# Patient Record
Sex: Female | Born: 1965 | Race: White | Hispanic: No | Marital: Married | Smoking: Never smoker
Health system: Southern US, Community
[De-identification: ages and names within clinical notes are randomized; demographics above are authoritative.]

---
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
## 1997-10-20 ENCOUNTER — Other Ambulatory Visit: Admission: RE | Admit: 1997-10-20 | Discharge: 1997-10-20 | Payer: Self-pay | Admitting: *Deleted

## 1999-08-01 ENCOUNTER — Other Ambulatory Visit: Admission: RE | Admit: 1999-08-01 | Discharge: 1999-08-01 | Payer: Self-pay | Admitting: Obstetrics and Gynecology

## 1999-10-06 ENCOUNTER — Encounter: Payer: Self-pay | Admitting: Obstetrics and Gynecology

## 1999-10-06 ENCOUNTER — Inpatient Hospital Stay (HOSPITAL_COMMUNITY): Admission: AD | Admit: 1999-10-06 | Discharge: 1999-10-06 | Payer: Self-pay | Admitting: Obstetrics and Gynecology

## 2000-01-31 ENCOUNTER — Inpatient Hospital Stay (HOSPITAL_COMMUNITY): Admission: AD | Admit: 2000-01-31 | Discharge: 2000-02-02 | Payer: Self-pay | Admitting: Obstetrics and Gynecology

## 2001-03-23 ENCOUNTER — Other Ambulatory Visit: Admission: RE | Admit: 2001-03-23 | Discharge: 2001-03-23 | Payer: Self-pay | Admitting: Obstetrics and Gynecology

## 2002-03-01 ENCOUNTER — Other Ambulatory Visit: Admission: RE | Admit: 2002-03-01 | Discharge: 2002-03-01 | Payer: Self-pay | Admitting: Obstetrics and Gynecology

## 2005-04-26 ENCOUNTER — Encounter: Admission: RE | Admit: 2005-04-26 | Discharge: 2005-04-26 | Payer: Self-pay | Admitting: Family Medicine

## 2006-01-24 ENCOUNTER — Ambulatory Visit (HOSPITAL_COMMUNITY): Admission: RE | Admit: 2006-01-24 | Discharge: 2006-01-24 | Payer: Self-pay | Admitting: Gastroenterology

## 2006-04-25 ENCOUNTER — Emergency Department (HOSPITAL_COMMUNITY): Admission: EM | Admit: 2006-04-25 | Discharge: 2006-04-25 | Payer: Self-pay | Admitting: Family Medicine

## 2006-06-09 ENCOUNTER — Ambulatory Visit: Payer: Self-pay | Admitting: Family Medicine

## 2006-07-01 ENCOUNTER — Encounter: Admission: RE | Admit: 2006-07-01 | Discharge: 2006-07-21 | Payer: Self-pay | Admitting: Family Medicine

## 2006-10-22 ENCOUNTER — Telehealth (INDEPENDENT_AMBULATORY_CARE_PROVIDER_SITE_OTHER): Payer: Self-pay | Admitting: *Deleted

## 2008-10-28 ENCOUNTER — Ambulatory Visit: Payer: Self-pay | Admitting: Family Medicine

## 2008-10-28 DIAGNOSIS — M545 Low back pain: Secondary | ICD-10-CM

## 2008-11-02 ENCOUNTER — Encounter: Admission: RE | Admit: 2008-11-02 | Discharge: 2008-12-09 | Payer: Self-pay | Admitting: Family Medicine

## 2008-12-08 ENCOUNTER — Encounter: Payer: Self-pay | Admitting: Family Medicine

## 2009-10-23 ENCOUNTER — Emergency Department (HOSPITAL_COMMUNITY): Admission: EM | Admit: 2009-10-23 | Discharge: 2009-10-23 | Payer: Self-pay | Admitting: Emergency Medicine

## 2009-10-26 ENCOUNTER — Emergency Department (HOSPITAL_COMMUNITY): Admission: EM | Admit: 2009-10-26 | Discharge: 2009-10-26 | Payer: Self-pay | Admitting: Family Medicine

## 2009-10-30 ENCOUNTER — Emergency Department (HOSPITAL_COMMUNITY)
Admission: EM | Admit: 2009-10-30 | Discharge: 2009-10-30 | Payer: Self-pay | Source: Home / Self Care | Admitting: Emergency Medicine

## 2009-11-06 ENCOUNTER — Emergency Department (HOSPITAL_COMMUNITY): Admission: EM | Admit: 2009-11-06 | Discharge: 2009-11-06 | Payer: Self-pay | Admitting: Emergency Medicine

## 2010-08-10 NOTE — Consult Note (Signed)
[facility]  Patient:    [name], [name]                        MRN: [id number] Adm. Date:  10/06/99 Attending:  Silverio Lay, M.D.                          Consultation Report  EMERGENCY ROOM VISIT  REASON FOR CONSULTATION:  Second trimester vaginal bleeding.  HISTORY OF PRESENT ILLNESS:  This is a young, married white female; (gravida 2, para 1, aborta 0).  She is presenting at 20 weeks and one day of pregnancy for spontaneous vaginal bleeding upon wiping, which occurred earlier this afternoon.  This recurred at one episode, but did not recur needing wearing of protection.  The patient reports otherwise good fetal activity; denies any contractions, denies any pain, denies any leakage of fluids.  She also denies any trauma.  Upon arrival at maternity admission unit, she was placed on the monitor (which did not reveal any contractions).  Fetal heart rate was within normal limits. Vital signs were normal.  Uterus fundi was measuring adequate for date and nontender.  Speculum examination revealed mild leukorrhea, with no bleeding inside and no blood in the vaginal vault.  The cervix was closed, long and firm.  LAB DATA:  CBC was normal.  DIC panel normal.  Urinalysis normal.  ULTRASOUND:  Revealed a normal lying placenta, with normal fetal anatomy. What was remarkable was a placental cord insertion site with a sift measuring 2.4 x 1.6 x 2.8 cm.  ASSESSMENT:  Second trimester bleeding of unknown origin.  Anomaly of placental cord insertion.  PLAN:  The patient was discharged home with restrain in physical activity. She will be followed in the office in three days for repeat ultrasound.  She is instructed to call back if recurrence of bleeding. DD:  10/06/99 TD:  10/06/99 Job: [id number]

## 2010-08-10 NOTE — Op Note (Signed)
[name], [name]               ACCOUNT NO.:  [id number]   MEDICAL RECORD NO.:  [id number]          PATIENT TYPE:  AMB   LOCATION:  ENDO                         FACILITY:  [facility]   PHYSICIAN:  Anselmo Rod, M.D.  DATE OF BIRTH:  07-17-1965   DATE OF PROCEDURE:  01/24/2006  DATE OF DISCHARGE:                                 OPERATIVE REPORT   PROCEDURE PERFORMED:  Colonoscopy.   ENDOSCOPIST:  Anselmo Rod, M.D.   INSTRUMENT USED:  Olympus video colonoscope.   INDICATIONS FOR PROCEDURE:  A 45 year old white female with a history of  change in bowel habits and severe right lower quadrant pain to rule out  colonic polyps, masses, IBD, etc.   PRE-PROCEDURE PREPARATION:  Informed consent was procured from the patient.  The patient was fasted for eight hours prior to the procedure and prepped  with a gallon of TriLyte the night prior to the procedure.  Risks and  benefits of the procedure, including a 10% missed rate of cancer and polyps,  were discussed with the patient as well.   PRE-PROCEDURE PHYSICAL EXAMINATION:  VITAL SIGNS:  Stable vital signs.  NECK:  Supple.  CHEST:  Clear to auscultation.  HEART:  S1 and S2.  Regular.  ABDOMEN:  Soft with normal bowel sounds.   DESCRIPTION OF PROCEDURE:  The patient was placed in the left lateral  decubitus position and sedated with 140 mcg of fentanyl and 13.5 mg of  Versed in slow, incremental doses.  Once the patient was adequately sedated,  maintained on low flow oxygen, and continuous cardiac monitoring, the  Olympus video colonoscope was advanced from the rectum to the cecum with  difficulty.  The patient had a very tortuous colon.  The patient's position  was changed from the left lateral to the right lateral and then the supine  position with gentle application of abdominal pressure, reached the cecal  base.  The appendicle orifices and ileocecal valve were visualized and  photographed.  No masses, polyps, erosions,  ulcerations, or diverticula were  seen.  The terminal ileum appeared normal without lesions.  Retroflexion in  the rectum revealed small internal hemorrhoids with a prominent anal  papilla.  The patient tolerated the procedure well without complications.   IMPRESSION:  1. Unrevealing colonoscopy up to the terminal ileum except for a tortuous      colon.  2. Patient had abdominal discomfort with insufflation of air into the      colon, indicating a component of visceral hypersensitivity, consistent      with irritable bowel syndrome.   RECOMMENDATIONS:  1. Continue a high-fiber diet with liberal fluid intake.  2. Repeat colonoscopy at age 59 and if the patient develops any abnormal      symptoms in the interim.  3. Outpatient followup in the next two weeks for further recommendations.      Anselmo Rod, M.D.  Electronically Signed     JNM/MEDQ  D:  01/24/2006  T:  01/24/2006  Job:  [id number]   cc:   Lenoard Aden, M.D.  Alexia Freestone,  M.D. 

## 2010-08-10 NOTE — H&P (Signed)
[facility]  Patient:    [name], [name]                        MRN: [id number] Adm. Date:  [id number] Attending:  Silverio Lay A                         History and Physical  REASON FOR ADMISSION:         Intrauterine pregnancy at 36 weeks and 2 days with spontaneous rupture of membranes at 1:00 a.m.  HISTORY OF PRESENT ILLNESS:   This is a 45 year old married white female Gravida 3 Para 1 AB 1 with a due date of February 23, 2000, being admitted at 36+ weeks for a spontaneous rupture of membranes at 1:00 a.m. of clear fluid. She reports irregular uterine activity.  Reports good fetal activity.  Denies any bleeding and denies any symptoms of pregnancy-induced hypertension. She was evaluated at maternity admissions with positive sign, positive nitrazine, and reactive fetal heart rate tracing.  She is known Group B strep positive. Patient would like to avoid Pitocin augmentation, so she was allowed to ambulate for a few hours.  She is currently contracting every one-and-a-half to three minutes but very mild intensity, 3/10, and fetal heart rate is reacting.  She has already received one dose of penicillin.  PRENATAL COURSE:              Reveals blood type O-negative, RPR nonreactive, rubella immune, HBS staging negative, HIV declined, gonorrhea negative, chlamydia negative, Pap smear normal.  In July, patient was seen in maternity admissions for second trimester bleeding where a placental cyst was diagnosed on ultrasound measuring 2.4 x 1.6 x 2.8 cm.  Ultrasound was repeated at 20 weeks and revealed a subchorionic hematoma of 2.2 x 0.9 cm and cyst resolved. At 16 weeks, ASP was within normal limits.  A 25-week ultrasound, normal placenta, estimated fetal weight of 56 percentile.  A 28-week glucose tolerance test was within normal limits.  RhoGAM received at 28 weeks. Otherwise, prenatal course was uneventful.  PAST MEDICAL HISTORY:         No known  drug allergies.  1990, termination of pregnancy at 5 weeks without complication.  June 1998, spontaneous vaginal delivery at 36 weeks of a female infant weighing 5 pounds 8 ounces, no complication.  Status post LEEP in 1995.  FAMILY HISTORY:               Father and brother with asthma.  SOCIAL HISTORY:               Married, nonsmoker.  PHYSICAL EXAMINATION:  VITAL SIGNS:                  Normal.  HEENT:                        Normal.  NECK:                         Normal.  LUNGS:                        Clear.  HEART:                        Normal.  ABDOMEN:  Gravida, nontender.  Vertex presentation.  PELVIC:                       Vaginal exam done at maternity admissions on arrival at 2 cm, 50% effaced, posterior vertex -1 to 0.  EXTREMITIES:                  Negative.  ASSESSMENT:                   Intrauterine pregnancy at 36+ weeks with spontaneous rupture of membranes and Group B strep positive.  PLAN:                         Patient will be started on pitocin augmentation and will be on penicillin protocol.  Spontaneous vaginal delivery is expected. D:  01/31/00 TD:  01/31/00 Job: [id number]

## 2010-08-10 NOTE — Assessment & Plan Note (Signed)
[facility]                            [facility] OFFICE NOTE   NAME:[name], [name]                      MRN:          [id number]  DATE:06/09/2006                            DOB:          January 02, 1966    This is a 45 year old woman here to establish with our practice and is  also complaining of an injury to her left ankle.  On  January 31 of this  year while carrying a load of laundry down some steps at her home she  tripped and fell, twisting her left ankle.  She went to an urgent care  center and was seen that day.  X-rays were normal except for some soft  tissue swelling.  She was given crutches for several days and wore an  aircast splint for several days.  She used ice for several days and took  Motrin.  A lot of the initial pain and swelling went away, but she has  still had continued difficulty with stiffness, swelling and pain both on  the inside and outside of the left ankle ever since.  Another problem  she incidentally mentions is of an itchy chronic rash that comes and  goes under both arm pits.  She had been prescribed steroid creams in the  past.  Tis seemed to help a lot with the itching but inevitably the rash  would come back.   PAST MEDICAL HISTORY:  She has had two vaginal deliveries.  She sees Dr.  Billy Coast for regular gynecology care.  She has never had a surgery.   ALLERGIES:  None.   CURRENT MEDICATIONS:  Necon once daily.   HABITS:  She drinks some alcohol.  She does not use tobacco.   SOCIAL HISTORY:  She is married with two children.  She is a Futures trader.   FAMILY HISTORY:  Remarkable for prostate cancer in a grandfather and  hypertension in her parents.   OBJECTIVE:  VITAL SIGNS:  Height 5 feet 8 inches.  Weight 135.  BP  108/72.  Pulse 76 and regular.  GENERAL:  She appears to be healthy.  EXTREMITIES:  She walks with no discernible limp.  There is some mild  puffiness around the left lateral and medial malleoli, both  medially and  laterally she is somewhat tender to palpation and has a mildly decreased  range of motion in all directions.  SKIN:  Examination of the axilla reveals no masses and no discernible  rash.   ASSESSMENT/PLAN:  1. Ankle sprain probably with some significant tibiotalar and      fibulotalar ligament injury.  We will set her up for physical      therapy for the next two to four weeks to see if this can aid in      her      recovery.  2. Possible tinea in the axillae.  Nizoral cream b.i.d. as needed.  I      advised her to avoid wearing deodorant until this clears up.     Tera Mater. Clent Ridges, MD  Electronically Signed    SAF/MedQ  DD: 06/09/2006  DT: 06/09/2006  Job #: [id number]

## 2010-09-15 IMAGING — CR DG LUMBAR SPINE COMPLETE 4+V
5 series · 5 of 5 positions shown · non-contrast
Comparison: None available.

CLINICAL DATA: Lumbar back pain.  Low back pain.

LUMBAR SPINE - COMPLETE 4+ VIEW

[view not recorded (1 of 5)]
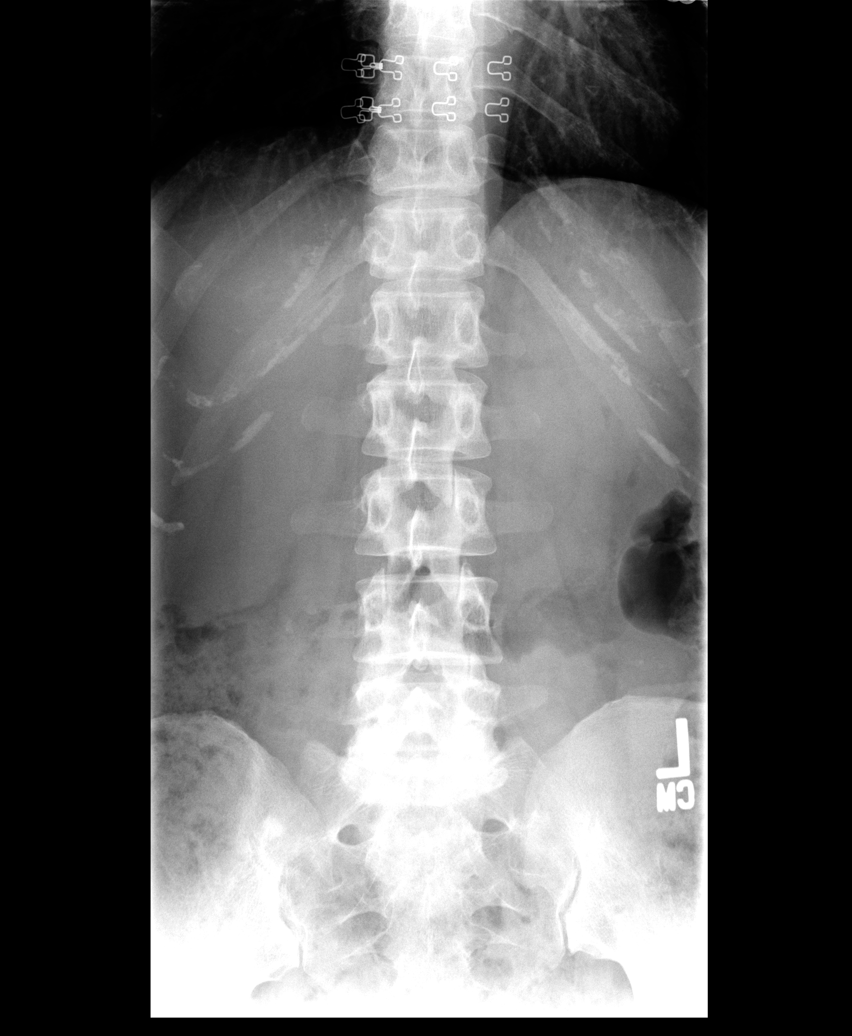

[view not recorded (2 of 5)]
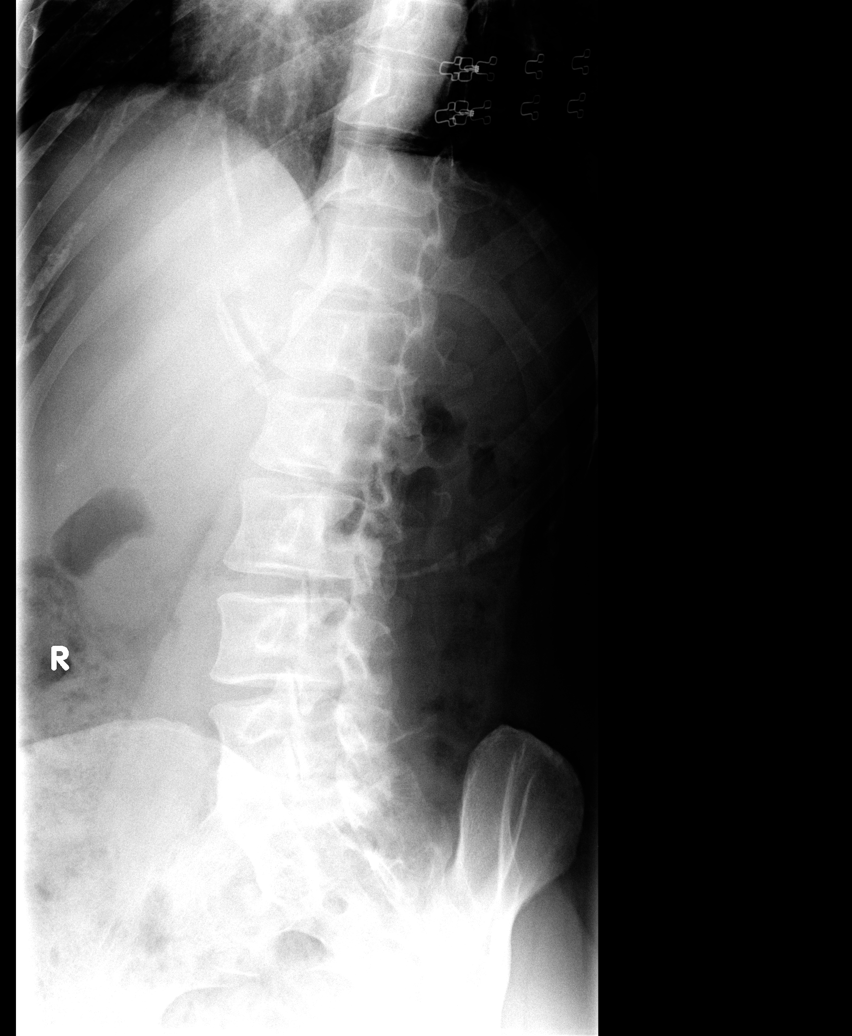

[view not recorded (3 of 5)]
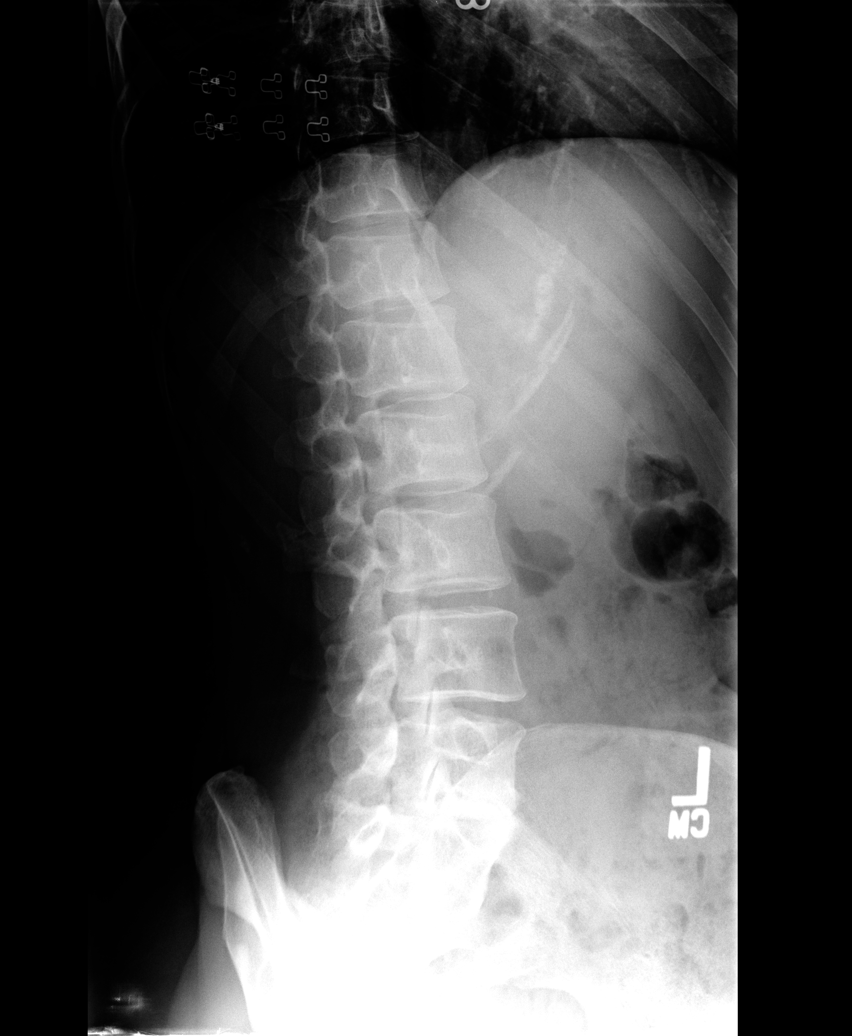

[view not recorded (4 of 5)]
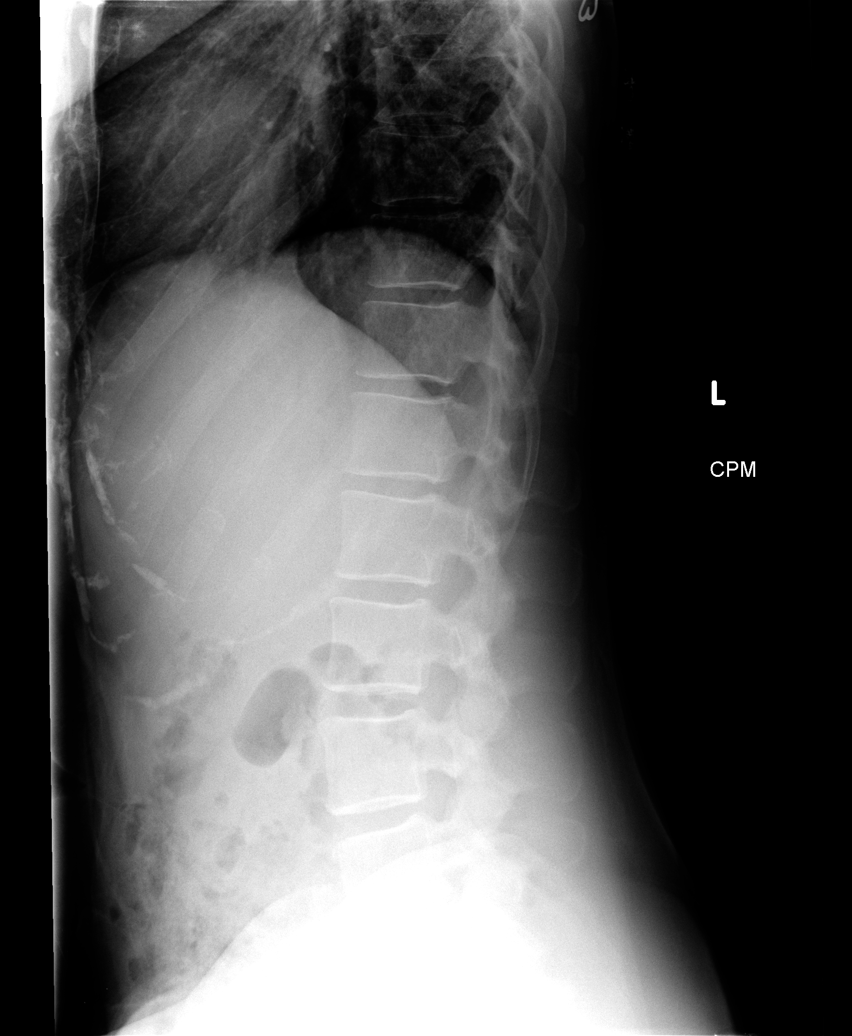

[view not recorded (5 of 5)]
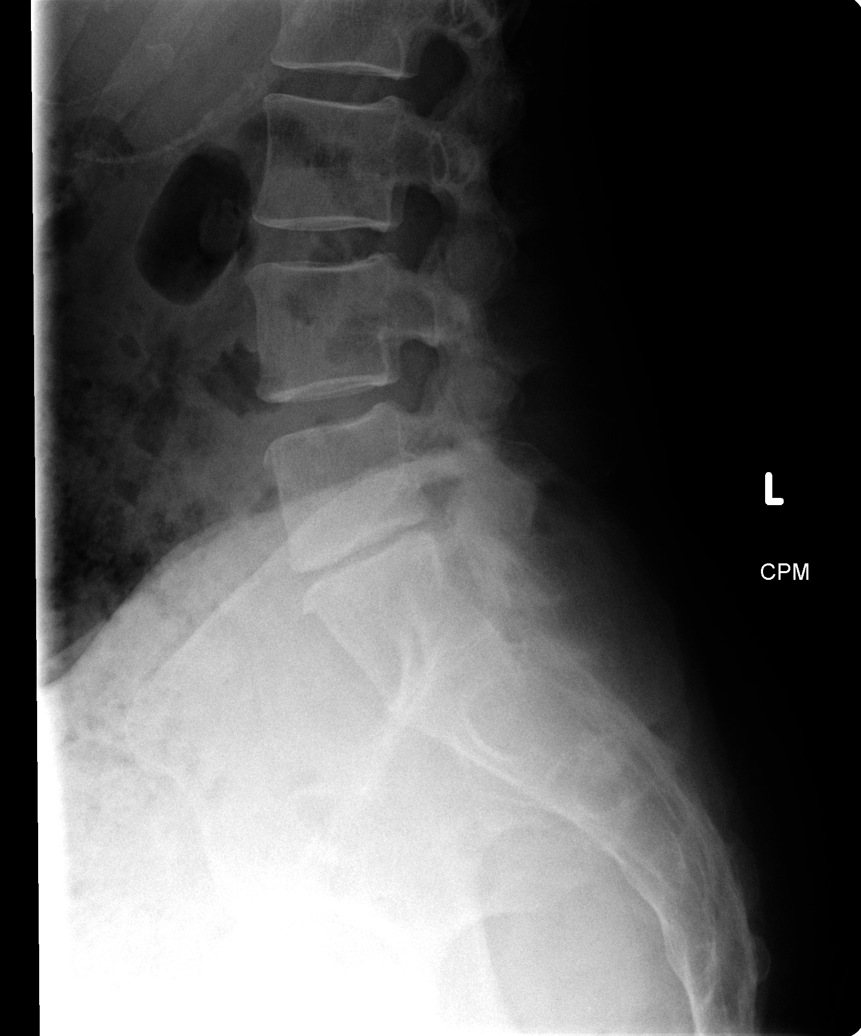

[5 of 5 positions shown; findings below may reference images not displayed]

FINDINGS: Lumbosacral transitional anatomy is present with a lumbar
rise to S1 vertebral body.  Five non-rib bearing lumbar type
vertebral bodies are present.  No pars defects are identified.  The
alignment is anatomic.  Disc space loss is present at L4-L5 with
marginal osteophytes.  Mild spurring anteriorly at L5.
IMPRESSION: 1.  Lumbosacral transitional anatomy.
2.  No acute osseous abnormality.  L5-S1 degenerative disease.

## 2011-02-06 ENCOUNTER — Ambulatory Visit (INDEPENDENT_AMBULATORY_CARE_PROVIDER_SITE_OTHER): Payer: BC Managed Care – PPO | Admitting: Family Medicine

## 2011-02-06 ENCOUNTER — Encounter: Payer: Self-pay | Admitting: Family Medicine

## 2011-02-06 VITALS — BP 124/82 | HR 86 | Temp 98.3°F | Wt 141.0 lb

## 2011-02-06 DIAGNOSIS — J329 Chronic sinusitis, unspecified: Secondary | ICD-10-CM

## 2011-02-06 MED ORDER — AZITHROMYCIN 250 MG PO TABS: ORAL_TABLET | ORAL | Status: AC

## 2011-02-06 NOTE — Progress Notes (Signed)
  Subjective:    Patient ID: [name], female    DOB: 11-16-1965, 45 y.o.   MRN: [id number]  HPI Here for 6 weeks of sinus pressure and PND, ST,and a dry cough. No fever.    Review of Systems  Constitutional: Negative.   HENT: Positive for congestion, postnasal drip and sinus pressure.   Eyes: Negative.   Respiratory: Positive for cough.        Objective:   Physical Exam  Constitutional: She appears well-developed and well-nourished.  HENT:  Right Ear: External ear normal.  Left Ear: External ear normal.  Nose: Nose normal.  Mouth/Throat: Oropharynx is clear and moist. No oropharyngeal exudate.  Eyes: Conjunctivae are normal. Pupils are equal, round, and reactive to light.  Neck: No thyromegaly present.  Pulmonary/Chest: Effort normal and breath sounds normal.  Lymphadenopathy:    She has no cervical adenopathy.          Assessment & Plan:  Add Mucinex

## 2011-03-26 HISTORY — PX: COLONOSCOPY: SHX174

## 2011-06-03 ENCOUNTER — Encounter: Payer: Self-pay | Admitting: Internal Medicine

## 2011-06-03 ENCOUNTER — Ambulatory Visit (INDEPENDENT_AMBULATORY_CARE_PROVIDER_SITE_OTHER): Payer: BC Managed Care – PPO | Admitting: Internal Medicine

## 2011-06-03 ENCOUNTER — Telehealth: Payer: Self-pay | Admitting: Family Medicine

## 2011-06-03 VITALS — BP 130/90 | Temp 97.9°F | Wt 140.0 lb

## 2011-06-03 DIAGNOSIS — J019 Acute sinusitis, unspecified: Secondary | ICD-10-CM

## 2011-06-03 NOTE — Progress Notes (Signed)
  Subjective:    Patient ID: [name], female    DOB: 07-Aug-1965, 46 y.o.   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 At times sputum production is yellow. She has been using ibuprofen and Tylenol for symptom control.  She has a child at home who has been ill and has been out of school last week. Her fever has improved    Review of Systems  Constitutional: Positive for fever and fatigue.  HENT: Positive for congestion, rhinorrhea, voice change and postnasal drip. Negative for hearing loss, sore throat, dental problem, sinus pressure and tinnitus.   Eyes: Negative for pain, discharge and visual disturbance.  Respiratory: Positive for cough. Negative for shortness of breath.   Cardiovascular: Negative for chest pain, palpitations and leg swelling.  Gastrointestinal: Negative for nausea, vomiting, abdominal pain, diarrhea, constipation, blood in stool and abdominal distention.  Genitourinary: Negative for dysuria, urgency, frequency, hematuria, flank pain, vaginal bleeding, vaginal discharge, difficulty urinating, vaginal pain and pelvic pain.  Musculoskeletal: Negative for joint swelling, arthralgias and gait problem.  Skin: Negative for rash.  Neurological: Negative for dizziness, syncope, speech difficulty, weakness, numbness and headaches.  Hematological: Negative for adenopathy.  Psychiatric/Behavioral: Negative for behavioral problems, dysphoric mood and agitation. The patient is not nervous/anxious.        Objective:   Physical Exam  Constitutional: She is oriented to person, place, and time. She appears well-developed and well-nourished.  HENT:  Head: Normocephalic.  Right Ear: External ear normal.  Left Ear: External ear normal.  Mouth/Throat: Oropharynx is clear and moist.  Eyes: Conjunctivae and EOM are normal. Pupils are equal, round, and reactive to  light.  Neck: Normal range of motion. Neck supple. No thyromegaly present.  Cardiovascular: Normal rate, regular rhythm, normal heart sounds and intact distal pulses.   Pulmonary/Chest: Effort normal and breath sounds normal.  Abdominal: Soft. Bowel sounds are normal. She exhibits no mass. There is no tenderness.  Musculoskeletal: Normal range of motion.  Lymphadenopathy:    She has no cervical adenopathy.  Neurological: She is alert and oriented to person, place, and time.  Skin: Skin is warm and dry. No rash noted.  Psychiatric: She has a normal mood and affect. Her behavior is normal.          Assessment & Plan:   Viral rhinosinusitis. We'll treat symptomatically.

## 2011-06-03 NOTE — Patient Instructions (Signed)
Acute sinusitis symptoms for less than 10 days are generally not helped by antibiotic therapy.  Use saline irrigation, warm  moist compresses and over-the-counter decongestants only as directed.  Call if there is no improvement in 5 to 7 days, or sooner if you develop increasing pain, fever, or any new symptoms.  Tylenol 1-2 tabs po q4h prn  duexis one twice daily

## 2011-06-03 NOTE — Telephone Encounter (Signed)
Triage Record Num: [id number] Operator: Craig Guess Patient Name: [name] Call Date & Time: 06/02/2011 11:00:29AM Patient Phone: [phone] PCP: Tera Mater. Clent Ridges Patient Gender: Female PCP Fax : [phone] Patient DOB: [birth year] Practice Name: [facility] Reason for Call: Caller: Chloey/Patient; PCP: Nelwyn Salisbury.; CB#: [phone]. Caller reports she has had a fever since Wed 3/6. Congestion, headache and ear pain. Caller wonders if she has a sinus infection. Current temp is 100.8. OTC product at hs did not help with sxs. Caller asking if there is a clinic today. Caller advised no clinic today, advised she can be seen at [facility] today or use Home care (per URI) that was given and be seen in the office on Monday 3/11. Caller prefers UC today and was given info re same. Protocol(s) Used: Upper Respiratory Infection (URI) Recommended Outcome per Protocol: See Provider within 24 hours Reason for Outcome: Facial pain (fullness, pressure, worsens with bending over), frontal headache, yellow-green nasal discharge AND any temperature elevation Care Advice: ~ Use a cool mist humidifier to moisten air. Be sure to clean according to manufacturer's instructions. ~ See provider in 8 hours if redness, swelling and tenderness develops above or below eyes/near ears/over sinuses ~ Consider use of a saline nasal spray per package directions to help relieve nasal congestion. A warm, moist compress placed on face, over eyes for 15 to 20 minutes, 5 to 6 times a day, may help relieve the congestion. ~ Consider nonprescription decongestant (Sudafed, Drixoral) for relief of symptoms after checking with a provider, especially if there is a history of hypertension, hyperthyroidism, heart disease, diabetes, glaucoma, urinary retention caused by prostatic hypertrophy. ~ Analgesic/Antipyretic Advice - Acetaminophen: Consider acetaminophen as directed on label or by pharmacist/provider for pain  or fever PRECAUTIONS: - Use if there is no history of liver disease, alcoholism, or intake of three or more alcohol drinks per day - Only if approved by provider during pregnancy or when breastfeeding - During pregnancy, acetaminophen should not be taken more than 3 consecutive days without telling provider - Do not exceed recommended dose or frequency ~ Analgesic/Antipyretic Advice - NSAIDs: Consider aspirin, ibuprofen, naproxen or ketoprofen for pain or fever as directed on label or by pharmacist/provider. PRECAUTIONS: - If over 73 years of age, should not take longer than 1 week without consulting provider. EXCEPTIONS: - Should not be used if taking blood thinners or have bleeding problems. - Do not use if have history of sensitivity/allergy to any of these medications; or history of cardiovascular, ulcer, kidney, liver disease or diabetes unless approved by provider. - Do not exceed recommended dose or frequency. ~ 06/02/2011 11:08:40AM Page 1 of 1 [email or web address]

## 2013-01-18 ENCOUNTER — Telehealth: Payer: Self-pay | Admitting: Family Medicine

## 2013-01-18 NOTE — Telephone Encounter (Signed)
Okay per Dr. Fry.  

## 2013-01-18 NOTE — Telephone Encounter (Signed)
Pt would like MD to accept her husband as new pt. Can I sch?

## 2013-01-20 NOTE — Telephone Encounter (Signed)
Pt will have her hus callback to sch appt

## 2014-05-03 ENCOUNTER — Encounter: Payer: Self-pay | Admitting: Family Medicine

## 2014-05-03 ENCOUNTER — Ambulatory Visit (INDEPENDENT_AMBULATORY_CARE_PROVIDER_SITE_OTHER): Payer: BLUE CROSS/BLUE SHIELD | Admitting: Family Medicine

## 2014-05-03 VITALS — BP 122/73 | HR 93 | Temp 100.2°F | Ht 68.25 in | Wt 142.0 lb

## 2014-05-03 DIAGNOSIS — J0100 Acute maxillary sinusitis, unspecified: Secondary | ICD-10-CM

## 2014-05-03 MED ORDER — AZITHROMYCIN 250 MG PO TABS: ORAL_TABLET | ORAL | Status: DC

## 2014-05-03 NOTE — Progress Notes (Signed)
Pre visit review using our clinic review tool, if applicable. No additional management support is needed unless otherwise documented below in the visit note. 

## 2014-05-03 NOTE — Progress Notes (Signed)
   Subjective:    Patient ID: [name], female    DOB: Feb 18, 1966, 49 y.o.   MRN: [id number]  HPI Here for one week of HA across the forehead and in the temples, pressure in both ears, and PND. No cough. She has had a low grade fever.    Review of Systems  Constitutional: Positive for fever.  HENT: Positive for congestion, postnasal drip and sinus pressure.   Eyes: Negative.   Respiratory: Negative.        Objective:   Physical Exam  Constitutional: She appears well-developed and well-nourished.  HENT:  Right Ear: External ear normal.  Left Ear: External ear normal.  Nose: Nose normal.  Mouth/Throat: Oropharynx is clear and moist.  Eyes: Conjunctivae are normal.  Pulmonary/Chest: Effort normal and breath sounds normal.  Lymphadenopathy:    She has no cervical adenopathy.          Assessment & Plan:  Add Mucinex D. Recheck prn

## 2014-08-11 ENCOUNTER — Telehealth: Payer: Self-pay | Admitting: Family Medicine

## 2014-08-11 ENCOUNTER — Ambulatory Visit (INDEPENDENT_AMBULATORY_CARE_PROVIDER_SITE_OTHER): Payer: BLUE CROSS/BLUE SHIELD | Admitting: Family Medicine

## 2014-08-11 ENCOUNTER — Encounter: Payer: Self-pay | Admitting: Family Medicine

## 2014-08-11 VITALS — BP 130/80 | HR 84 | Temp 97.5°F | Wt 141.0 lb

## 2014-08-11 DIAGNOSIS — G44209 Tension-type headache, unspecified, not intractable: Secondary | ICD-10-CM

## 2014-08-11 DIAGNOSIS — R233 Spontaneous ecchymoses: Secondary | ICD-10-CM | POA: Diagnosis not present

## 2014-08-11 DIAGNOSIS — H1131 Conjunctival hemorrhage, right eye: Secondary | ICD-10-CM

## 2014-08-11 LAB — CBC WITH DIFFERENTIAL/PLATELET
BASOS ABS: 0 10*3/uL (ref 0.0–0.1)
BASOS PCT: 0.4 % (ref 0.0–3.0)
EOS PCT: 1 % (ref 0.0–5.0)
Eosinophils Absolute: 0.1 10*3/uL (ref 0.0–0.7)
HCT: 44.3 % (ref 36.0–46.0)
Hemoglobin: 15.1 g/dL — ABNORMAL HIGH (ref 12.0–15.0)
Lymphocytes Relative: 26.7 % (ref 12.0–46.0)
Lymphs Abs: 1.9 10*3/uL (ref 0.7–4.0)
MCHC: 34 g/dL (ref 30.0–36.0)
MCV: 94.1 fl (ref 78.0–100.0)
MONO ABS: 0.4 10*3/uL (ref 0.1–1.0)
Monocytes Relative: 5.8 % (ref 3.0–12.0)
Neutro Abs: 4.8 10*3/uL (ref 1.4–7.7)
Neutrophils Relative %: 66.1 % (ref 43.0–77.0)
Platelets: 195 10*3/uL (ref 150.0–400.0)
RBC: 4.71 Mil/uL (ref 3.87–5.11)
RDW: 12.8 % (ref 11.5–15.5)
WBC: 7.3 10*3/uL (ref 4.0–10.5)

## 2014-08-11 NOTE — Telephone Encounter (Signed)
PLEASE NOTE: All timestamps contained within this report are represented as [place] Standard Time. CONFIDENTIALTY NOTICE: This fax transmission is intended only for the addressee. It contains information that is legally privileged, confidential or otherwise protected from use or disclosure. If you are not the intended recipient, you are strictly prohibited from reviewing, disclosing, copying using or disseminating any of this information or taking any action in reliance on or regarding this information. If you have received this fax in error, please notify us immediately by telephone so that we can arrange for its return to us. Phone: [phone], Toll-Free: [phone], Fax: [phone] Page: 1 of 1 Call Id: [id number]  [facility] - Client TELEPHONE ADVICE RECORD [facility] Patient Name: [name] DOB: 11-02-1965 Initial Comment Caller states Monday she noticed blood vessel in eye had popped; friends adv to have BP checked cause headache since Monday, too; Nurse Assessment Nurse: Elijah Birkaldwell, RN, Lynda Date/Time (Eastern Time): 08/11/2014 10:17:25 AM Confirm and document reason for call. If symptomatic, describe symptoms. ---Caller states Monday she noticed a blood vessel in her right eye had popped. Her friends advised her to have BP checked because she has had headache since Monday, too. No injury/ strenuous exercise. Has also noticed a rash at bottom of both shins. Has the patient traveled out of the country within the last 30 days? ---Not Applicable Does the patient require triage? ---Yes Related visit to physician within the last 2 weeks? ---No Does the PT have any chronic conditions? (i.e. diabetes, asthma, etc.) ---Yes List chronic conditions. ---hypothyroidism Did the patient indicate they were pregnant? ---No Guidelines Guideline Title Affirmed Question Affirmed Notes Headache [1] MODERATE headache (e.g., interferes with  normal activities) AND [2] present > 24 hours AND [3] unexplained (Exceptions: analgesics not tried, typical migraine, or headache part of viral illness) Final Disposition User See Physician within [address]24 Hours [place], Charity fundraiserN, [place]

## 2014-08-11 NOTE — Progress Notes (Signed)
   Subjective:    Patient ID: [name], female    DOB: 01/27/1966, 49 y.o.   MRN: [id number]  HPI Patient seen as a work in with the following issues  Right subconjunctival hemorrhage. She first noted Monday morning around 9:30 to 10 AM. No history of injury. No blurred vision. No eye pain. Does not take aspirin or any anticoagulants. She has not noted any recent excessive bruising.  Headache. Dull mild bilateral frontal sinus type headache over the past few days. No nausea or vomiting. She took some ibuprofen which helped. Denies any sinusitis symptoms. No sinus congestion. Headache relatively mild. No fever whatsoever  Mild faint petechial type rash on lower legs bilaterally. Possibly some very mild leg edema. No recent tick bites. No upper extremity or trunk petechiae. No easy bruising other than right subconjunctival hemorrhage above. No new arthralgias.  No past medical history on file. No past surgical history on file.  reports that she has never smoked. She has never used smokeless tobacco. She reports that she drinks alcohol. She reports that she does not use illicit drugs. family history includes Hypertension in an other family member; Prostate cancer in an other family member. No Known Allergies    Review of Systems  Constitutional: Negative for fever, chills and fatigue.  Eyes: Negative for photophobia, pain, discharge, itching and visual disturbance.  Respiratory: Negative for shortness of breath.   Cardiovascular: Negative for chest pain.  Gastrointestinal: Negative for nausea, vomiting and abdominal pain.  Musculoskeletal: Negative for arthralgias.  Skin: Positive for rash.  Neurological: Positive for headaches. Negative for dizziness.  Hematological: Negative for adenopathy.       Objective:   Physical Exam  Constitutional: She appears well-developed and well-nourished.  HENT:  Head: Normocephalic and atraumatic.  Mouth/Throat: Oropharynx is clear and  moist.  Eyes: Pupils are equal, round, and reactive to light.  Subconjunctival hemorrhage right eye laterally. Pupils equal round reactive to light. Fundi benign.  Neck: Neck supple. No thyromegaly present.  Cardiovascular: Normal rate and regular rhythm.   No murmur heard. Pulmonary/Chest: Effort normal and breath sounds normal. No respiratory distress. She has no wheezes. She has no rales.  Lymphadenopathy:    She has no cervical adenopathy.  Neurological: She is alert. No cranial nerve deficit. Coordination normal.  Skin:  She has very fine nonblanching petechiae lower legs. None involving the feet and none involving the upper extremities or trunk. No pitting leg edema          Assessment & Plan:  #1 right subconjunctival hemorrhage. We explained these are usually spontaneous and benign. She does not have any concerning symptoms otherwise and observe for now #2 dull headache. Suspect tension-type headache. We explained this is not likely related to her spontaneous subconjunctival hemorrhage.  No fever whatsoever and no history of tick bites #3 few scattered small petechiae lower legs. She does not have any concerns for infectious origin such as fever or chills. She does not have evidence for any bruising. No generalized petechiae. Will check CBC even though again think unrelated to subconjunctival hemorrhage above No recent tick bites or other worrisome symptoms such as fever and generally feels well. Follow-up promptly for any fever or new symptoms

## 2014-08-11 NOTE — Progress Notes (Signed)
Pre visit review using our clinic review tool, if applicable. No additional management support is needed unless otherwise documented below in the visit note. 

## 2014-08-11 NOTE — Telephone Encounter (Signed)
Has appointment to see MD Burchette today at 1:45

## 2014-08-11 NOTE — Patient Instructions (Signed)
Subconjunctival Hemorrhage A subconjunctival hemorrhage is a bright red patch covering a portion of the white of the eye. The white part of the eye is called the sclera, and it is covered by a thin membrane called the conjunctiva. This membrane is clear, except for tiny blood vessels that you can see with the naked eye. When your eye is irritated or inflamed and becomes red, it is because the vessels in the conjunctiva are swollen. Sometimes, a blood vessel in the conjunctiva can break and bleed. When this occurs, the blood builds up between the conjunctiva and the sclera, and spreads out to create a red area. The red spot may be very small at first. It may then spread to cover a larger part of the surface of the eye, or even all of the visible white part of the eye. In almost all cases, the blood will go away and the eye will become white again. Before completely dissolving, however, the red area may spread. It may also become brownish-yellow in color before going away. If a lot of blood collects under the conjunctiva, it may look like a bulge on the surface of the eye. This looks scary, but it will also eventually flatten out and go away. Subconjunctival hemorrhages do not cause pain, but if swollen, may cause a feeling of irritation. There is no effect on vision.  CAUSES   The most common cause is mild trauma (rubbing the eye, irritation).  Subconjunctival hemorrhages can happen because of coughing or straining (lifting heavy objects), vomiting, or sneezing.  In some cases, your doctor may want to check your blood pressure. High blood pressure can also cause a subconjunctival hemorrhage.  Severe trauma or blunt injuries.  Diseases that affect blood clotting (hemophilia, leukemia).  Abnormalities of blood vessels behind the eye (carotid cavernous sinus fistula).  Tumors behind the eye.  Certain drugs (aspirin, Coumadin, heparin).  Recent eye surgery. HOME CARE INSTRUCTIONS   Do not worry  about the appearance of your eye. You may continue your usual activities.  Often, follow-up is not necessary. SEEK MEDICAL CARE IF:   Your eye becomes painful.  The bleeding does not disappear within 3 weeks.  Bleeding occurs elsewhere, for example, under the skin, in the mouth, or in the other eye.  You have recurring subconjunctival hemorrhages. SEEK IMMEDIATE MEDICAL CARE IF:   Your vision changes or you have difficulty seeing.  You develop a severe headache, persistent vomiting, confusion, or abnormal drowsiness (lethargy).  Your eye seems to bulge or protrude from the eye socket.  You notice the sudden appearance of bruises or have spontaneous bleeding elsewhere on your body. Document Released: 03/11/2005 Document Revised: 07/26/2013 Document Reviewed: 02/06/2009 [facility] Patient Information 2015 [employer], [place]. This information is not intended to replace advice given to you by your health care provider. Make sure you discuss any questions you have with your health care provider.  Be in touch for any fever or progressive/spreading petechiae.

## 2015-07-17 ENCOUNTER — Encounter: Payer: Self-pay | Admitting: Family Medicine

## 2015-07-17 ENCOUNTER — Ambulatory Visit (INDEPENDENT_AMBULATORY_CARE_PROVIDER_SITE_OTHER): Payer: BLUE CROSS/BLUE SHIELD | Admitting: Family Medicine

## 2015-07-17 VITALS — BP 146/87 | HR 74 | Temp 98.1°F | Wt 148.2 lb

## 2015-07-17 DIAGNOSIS — L989 Disorder of the skin and subcutaneous tissue, unspecified: Secondary | ICD-10-CM

## 2015-07-17 MED ORDER — DESONIDE 0.05 % EX CREA: TOPICAL_CREAM | Freq: Two times a day (BID) | CUTANEOUS | Status: DC

## 2015-07-17 NOTE — Progress Notes (Signed)
Pre visit review using our clinic review tool, if applicable. No additional management support is needed unless otherwise documented below in the visit note. 

## 2015-07-17 NOTE — Patient Instructions (Signed)
Please use trial of steroid medication for one week and if lesion does not improve, a referral to dermatology will be made. [facility] can be considered for evaluation and management.

## 2015-07-17 NOTE — Progress Notes (Addendum)
s    Patient ID: [name], female    DOB: 1965/11/06, 50 y.o.   MRN: [id number]  HPI  [name] is a 50 year old female who presents today with a lesion on her face. She states that this lesion has been present for at least one month and possibly as long as 2 months. She describes it as scaly, pruritic, with redness that has not improved since she first noticed the lesion. She denies associated symptoms of fever, chills, sweats, unexplained weight loss, or any additional lesions.  No treatments have been tried at home at this time. Pertinent history of previous sun exposure   Review of Systems  Constitutional: Negative for fever, chills, fatigue and unexpected weight change.  Respiratory: Negative for cough and shortness of breath.   Cardiovascular: Negative for chest pain and palpitations.  Gastrointestinal: Negative for nausea, vomiting, abdominal pain and diarrhea.  Musculoskeletal: Negative for myalgias, joint swelling and arthralgias.  Skin:       Lesion on right side of face  Neurological: Negative for dizziness, numbness and headaches.  Hematological: Does not bruise/bleed easily.   No past medical history on file.   Social History   Social History  . Marital Status: Married    Spouse Name: N/A  . Number of Children: N/A  . Years of Education: N/A   Occupational History  . Not on file.   Social History Main Topics  . Smoking status: Never Smoker   . Smokeless tobacco: Never Used  . Alcohol Use: 0.0 oz/week    0 Standard drinks or equivalent per week     Comment: occ  . Drug Use: No  . Sexual Activity: Not on file   Other Topics Concern  . Not on file   Social History Narrative    No past surgical history on file.  Family History  Problem Relation Age of Onset  . Hypertension    . Prostate cancer      No Known Allergies  Current Outpatient Prescriptions on File Prior to Visit  Medication Sig Dispense Refill  . levothyroxine (SYNTHROID,  LEVOTHROID) 75 MCG tablet Take 75 mcg by mouth daily.  10  . norethindrone-ethinyl estradiol (JUNEL FE,GILDESS FE,LOESTRIN FE) 1-20 MG-MCG tablet Take 1 tablet by mouth daily.     No current facility-administered medications on file prior to visit.    BP 146/87 mmHg  Pulse 74  Temp(Src) 98.1 F (36.7 C) (Oral)  Wt 148 lb 3.2 oz (67.223 kg)       Objective:   Physical Exam  Constitutional: She is oriented to person, place, and time. She appears well-developed and well-nourished.  Eyes: Pupils are equal, round, and reactive to light.  Neck: Neck supple.  Cardiovascular: Normal rate and regular rhythm.   Pulmonary/Chest: Effort normal and breath sounds normal. She has no wheezes. She has no rales.  Lymphadenopathy:    She has no cervical adenopathy.  Neurological: She is alert and oriented to person, place, and time.  Skin: Skin is warm and dry.  Facial lesion located on the right side of face near jaw line approximately 1 cm x 1 cm. Scaly with redness noted at the base  Psychiatric: She has a normal mood and affect. Her behavior is normal. Thought content normal.     Assessment and Plan:  1. Skin lesion of face Advised patient that a trial of steroid cream for one week will be used and if lesion does not respond or improve a  referral to [facility] will be place for evaluation and treatment. Discussed the possibility of squamous cell carcinoma versus actinic keratosis and treatment of this nature would include destructive therapies or surgical procedure. Due to lesion located on patient's face, a referral to the [facility] will be placed if needed after trial of desonide. Also advised to use sunscreen.  Patient agreed with plan and voiced understanding.  - desonide (DESOWEN) 0.05 % cream; Apply topically 2 (two) times daily.  Dispense: 30 g; Refill: 0  Diane McJulia Jaimie Redditt, FNP-C

## 2016-10-07 ENCOUNTER — Other Ambulatory Visit: Payer: Self-pay | Admitting: Gastroenterology

## 2016-10-07 DIAGNOSIS — R1084 Generalized abdominal pain: Secondary | ICD-10-CM

## 2016-10-10 ENCOUNTER — Other Ambulatory Visit: Payer: BLUE CROSS/BLUE SHIELD

## 2017-02-05 ENCOUNTER — Ambulatory Visit: Payer: BLUE CROSS/BLUE SHIELD | Admitting: Family Medicine

## 2017-02-05 ENCOUNTER — Encounter: Payer: Self-pay | Admitting: Family Medicine

## 2017-02-05 VITALS — BP 102/60 | HR 89 | Temp 98.5°F | Wt 146.0 lb

## 2017-02-05 DIAGNOSIS — T1592XA Foreign body on external eye, part unspecified, left eye, initial encounter: Secondary | ICD-10-CM | POA: Diagnosis not present

## 2017-02-06 ENCOUNTER — Encounter: Payer: Self-pay | Admitting: Family Medicine

## 2017-02-06 NOTE — Progress Notes (Signed)
   Subjective:    Patient ID: [name], female    DOB: 1965/07/26, 51 y.o.   MRN: [id number]  HPI Here complaining of a foreign body sensation in the left eye. On 02-01-17 she was helping to remove ceiling tiles and some dust fell into her face. She immediately felt like something was in the eye, though there is no pain per se. She has tried to flush this out under a faucet, but this has not helped.    Review of Systems  Constitutional: Negative.   HENT: Negative.   Eyes: Negative for photophobia, pain, discharge, redness and visual disturbance.  Respiratory: Negative.        Objective:   Physical Exam  Constitutional: She appears well-developed and well-nourished.  HENT:  Right Ear: External ear normal.  Left Ear: External ear normal.  Nose: Nose normal.  Mouth/Throat: Oropharynx is clear and moist.  Eyes: Conjunctivae and EOM are normal. Pupils are equal, round, and reactive to light.  No objects can be seen in the eye. No corneal defects are seen using fluorescein and a Woods lamp   Neck: Neck supple. No thyromegaly present.  Cardiovascular: Normal rate, regular rhythm, normal heart sounds and intact distal pulses.  Pulmonary/Chest: Effort normal and breath sounds normal.  Lymphadenopathy:    She has no cervical adenopathy.          Assessment & Plan:  Foreign body sensation in the eye. The eye was irrigated with eye wash, and she immediately felt better with no more foreign body sensation. Recheck prn.  Gershon CraneStephen Fry, MD

## 2019-09-01 ENCOUNTER — Encounter: Payer: Self-pay | Admitting: Gastroenterology

## 2019-10-27 ENCOUNTER — Other Ambulatory Visit: Payer: Self-pay

## 2019-10-27 ENCOUNTER — Ambulatory Visit (AMBULATORY_SURGERY_CENTER): Payer: Self-pay

## 2019-10-27 VITALS — Ht 68.0 in | Wt 138.0 lb

## 2019-10-27 DIAGNOSIS — Z01818 Encounter for other preprocedural examination: Secondary | ICD-10-CM

## 2019-10-27 DIAGNOSIS — Z1211 Encounter for screening for malignant neoplasm of colon: Secondary | ICD-10-CM

## 2019-10-27 NOTE — Progress Notes (Signed)
No egg or soy allergy known to patient  No issues with past sedation with any surgeries or procedures No intubation problems in the past  No diet pills per patient No home 02 use per patient  No blood thinners per patient  Pt reports issues with constipation -patient advised to complete Miralax daily starting 5 days prior to procedure No A fib or A flutter  EMMI video to pt or MyChart  COVID 19 guidelines implemented in [facility] today   Coupon given to pt in [facility] today  COVID screening scheduled on 11/08/2019 at 10:20 am;  Due to the COVID-19 pandemic we are asking patients to follow these guidelines. Please only bring one care partner. Please be aware that your care partner may wait in the car in the parking lot or if they feel like they will be too hot to wait in the car, they may wait in the lobby on the 4th floor. All care partners are required to wear a mask the entire time (we do not have any that we can provide them), they need to practice social distancing, and we will do a Covid check for all patient's and care partners when you arrive. Also we will check their temperature and your temperature. If the care partner waits in their car they need to stay in the parking lot the entire time and we will call them on their cell phone when the patient is ready for discharge so they can bring the car to the front of the building. Also all patient's will need to wear a mask into building.

## 2019-11-10 ENCOUNTER — Encounter: Payer: BLUE CROSS/BLUE SHIELD | Admitting: Gastroenterology

## 2020-10-27 ENCOUNTER — Ambulatory Visit (INDEPENDENT_AMBULATORY_CARE_PROVIDER_SITE_OTHER): Payer: BLUE CROSS/BLUE SHIELD | Admitting: Family Medicine

## 2020-10-27 ENCOUNTER — Encounter: Payer: Self-pay | Admitting: Family Medicine

## 2020-10-27 ENCOUNTER — Other Ambulatory Visit: Payer: Self-pay

## 2020-10-27 DIAGNOSIS — M5441 Lumbago with sciatica, right side: Secondary | ICD-10-CM | POA: Diagnosis not present

## 2020-10-27 MED ORDER — CELECOXIB 100 MG PO CAPS: 100.0000 mg | ORAL_CAPSULE | Freq: Two times a day (BID) | ORAL | 3 refills | Status: AC | PRN

## 2020-10-27 MED ORDER — PREDNISONE 10 MG PO TABS: ORAL_TABLET | ORAL | 0 refills | Status: AC

## 2020-10-27 NOTE — Progress Notes (Signed)
Office Visit Note   Patient: [name]           Date of Birth: 07-11-1965           MRN: [id number] Visit Date: 10/27/2020 Requested by: Nelwyn Salisbury, MD [address] [place],  [place] [address] PCP: Nelwyn Salisbury, MD  Subjective: Chief Complaint  Patient presents with   Lower Back - Pain    Pain in the right lower back, into the hip and down the anterior thigh. This started in early May of this year. Worsened by the end of that month, to where she could barely walk.  Has been going to [facility] and seeing Dr. Lilian Coma for chiropractic care.    HPI: She is here at the request of Lorenda Peck for right-sided low back and leg pain.  She is a Runner, broadcasting/film/video at [employer].  In May she started having pain in her back with no injury.  She began seeing Dr. Hermelinda Medicus for chiropractic treatments.  Symptoms were improving but not going away completely so she added physical therapy with [facility].  This seemed to make a bigger difference for her, but it has been 3 months now and she is still not completely better.  It hurts to stand a long time, and hurts to walk.  Feels better to sit down.  Denies any bowel or bladder dysfunction.  The pain is in the right posterior hip and in the right lateral hip.  At first she had pain radiating down the anterior thigh but that seems to be improved.  She has not noticed any weakness.  No fevers or chills.  She likes to avoid medication.                ROS:   All other systems were reviewed and are negative.  Objective: Vital Signs: There were no vitals taken for this visit.  Physical Exam:  General:  Alert and oriented, in no acute distress. Pulm:  Breathing unlabored. Psy:  Normal mood, congruent affect.  Low back: She has mild tenderness to the right of L4-5 in the paraspinous muscles.  No bony tenderness.  No tenderness over the SI joint or sciatic notch.  She is moderately tender over the right greater trochanter.  Good hip range  of motion with no pain.  Negative straight leg raise.  Lower extremity strength and reflexes are normal.    Imaging: None today.  She had x-rays at her chiropractor's clinic.    Assessment & Plan: Low back and right leg pain, suspicious for lumbar disc protrusion.  Neurologic exam is nonfocal.  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 -Prednisone taper, Celebrex as needed.  McKenzie protocol exercises at home, and lateral leg raises for strengthening.  Consider MRI scan of the lumbar spine if symptoms persist.     Procedures: No procedures performed        PMFS History: Patient Active Problem List   Diagnosis Date Noted   BACK PAIN, LUMBAR 10/28/2008   History reviewed. No pertinent past medical history.  Family History  Problem Relation Age of Onset   Hypertension Other    Prostate cancer Father 45   Prostate cancer Paternal Grandfather 70   Colon polyps Neg Hx    Colon cancer Neg Hx    Esophageal cancer Neg Hx    Rectal cancer Neg Hx    Stomach cancer Neg Hx     Past Surgical History:  Procedure Laterality Date   COLONOSCOPY  2013   normal   WISDOM TOOTH EXTRACTION  1986   Social History   Occupational History   Not on file  Tobacco Use   Smoking status: Never   Smokeless tobacco: Never  Vaping Use   Vaping Use: Never used  Substance and Sexual Activity   Alcohol use: Yes    Alcohol/week: 0.0 standard drinks    Comment: occassionally   Drug use: No   Sexual activity: Not on file
